# Patient Record
Sex: Male | Born: 2000 | Race: White | Hispanic: No | Marital: Single | Smoking: Former smoker
Health system: Southern US, Community
[De-identification: ages and names within clinical notes are randomized; demographics above are authoritative.]

---
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
## 2019-01-16 ENCOUNTER — Other Ambulatory Visit: Payer: Self-pay | Admitting: Nurse Practitioner

## 2019-01-16 DIAGNOSIS — F320 Major depressive disorder, single episode, mild: Secondary | ICD-10-CM

## 2019-03-05 ENCOUNTER — Ambulatory Visit: Payer: Self-pay | Attending: Internal Medicine

## 2019-03-05 ENCOUNTER — Other Ambulatory Visit: Payer: Self-pay

## 2019-03-05 DIAGNOSIS — Z20822 Contact with and (suspected) exposure to covid-19: Secondary | ICD-10-CM

## 2019-03-06 LAB — NOVEL CORONAVIRUS, NAA: SARS-CoV-2, NAA: NOT DETECTED
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
